# Patient Record
Sex: Female | Born: 1990 | Race: Asian | Hispanic: No | Marital: Married
Health system: Southern US, Community
[De-identification: ages and names within clinical notes are randomized; demographics above are authoritative.]

## PROBLEM LIST (undated history)

## (undated) DIAGNOSIS — O24419 Gestational diabetes mellitus in pregnancy, unspecified control: Secondary | ICD-10-CM

## (undated) DIAGNOSIS — Z789 Other specified health status: Secondary | ICD-10-CM

## (undated) HISTORY — PX: EYE SURGERY: SHX253

## (undated) HISTORY — DX: Gestational diabetes mellitus in pregnancy, unspecified control: O24.419

## (undated) HISTORY — PX: THERAPEUTIC ABORTION: SHX798

## (undated) HISTORY — PX: DILATION AND CURETTAGE OF UTERUS: SHX78

---

## 2017-07-05 DIAGNOSIS — Z3009 Encounter for other general counseling and advice on contraception: Secondary | ICD-10-CM | POA: Diagnosis not present

## 2017-07-05 DIAGNOSIS — Z23 Encounter for immunization: Secondary | ICD-10-CM | POA: Diagnosis not present

## 2017-09-27 DIAGNOSIS — Z3401 Encounter for supervision of normal first pregnancy, first trimester: Secondary | ICD-10-CM | POA: Diagnosis not present

## 2017-11-01 ENCOUNTER — Other Ambulatory Visit (HOSPITAL_COMMUNITY)
Admission: RE | Admit: 2017-11-01 | Discharge: 2017-11-01 | Disposition: A | Discharge status: Home / Self Care | Payer: BLUE CROSS/BLUE SHIELD | Source: Ambulatory Visit | Attending: Obstetrics and Gynecology | Admitting: Obstetrics and Gynecology

## 2017-11-01 ENCOUNTER — Other Ambulatory Visit (HOSPITAL_COMMUNITY): Admit: 2017-11-01 | Payer: Self-pay

## 2017-11-01 ENCOUNTER — Other Ambulatory Visit: Payer: Self-pay | Admitting: Obstetrics and Gynecology

## 2017-11-01 DIAGNOSIS — Z124 Encounter for screening for malignant neoplasm of cervix: Secondary | ICD-10-CM | POA: Insufficient documentation

## 2017-11-01 DIAGNOSIS — Z3401 Encounter for supervision of normal first pregnancy, first trimester: Secondary | ICD-10-CM | POA: Diagnosis not present

## 2017-11-01 DIAGNOSIS — Z3A01 Less than 8 weeks gestation of pregnancy: Secondary | ICD-10-CM | POA: Diagnosis not present

## 2017-11-04 LAB — CYTOLOGY - PAP
CHLAMYDIA, DNA PROBE: NEGATIVE
DIAGNOSIS: NEGATIVE
NEISSERIA GONORRHEA: NEGATIVE

## 2017-11-12 DIAGNOSIS — O09511 Supervision of elderly primigravida, first trimester: Secondary | ICD-10-CM | POA: Diagnosis not present

## 2017-11-21 ENCOUNTER — Other Ambulatory Visit: Payer: Self-pay

## 2017-12-13 DIAGNOSIS — Z36 Encounter for antenatal screening for chromosomal anomalies: Secondary | ICD-10-CM | POA: Diagnosis not present

## 2017-12-13 DIAGNOSIS — Z3402 Encounter for supervision of normal first pregnancy, second trimester: Secondary | ICD-10-CM | POA: Diagnosis not present

## 2017-12-13 DIAGNOSIS — Z3A01 Less than 8 weeks gestation of pregnancy: Secondary | ICD-10-CM | POA: Diagnosis not present

## 2017-12-13 DIAGNOSIS — Z3401 Encounter for supervision of normal first pregnancy, first trimester: Secondary | ICD-10-CM | POA: Diagnosis not present

## 2017-12-14 ENCOUNTER — Other Ambulatory Visit (HOSPITAL_COMMUNITY): Payer: Self-pay | Admitting: Obstetrics and Gynecology

## 2017-12-14 ENCOUNTER — Other Ambulatory Visit (HOSPITAL_COMMUNITY): Payer: Self-pay | Admitting: *Deleted

## 2017-12-14 ENCOUNTER — Encounter (HOSPITAL_COMMUNITY): Payer: Self-pay

## 2017-12-14 ENCOUNTER — Ambulatory Visit (HOSPITAL_COMMUNITY)
Admission: RE | Admit: 2017-12-14 | Discharge: 2017-12-14 | Disposition: A | Discharge status: Home / Self Care | Payer: BLUE CROSS/BLUE SHIELD | Source: Ambulatory Visit | Attending: Obstetrics and Gynecology | Admitting: Obstetrics and Gynecology

## 2017-12-14 DIAGNOSIS — Z368A Encounter for antenatal screening for other genetic defects: Secondary | ICD-10-CM | POA: Insufficient documentation

## 2017-12-14 DIAGNOSIS — O358XX0 Maternal care for other (suspected) fetal abnormality and damage, not applicable or unspecified: Secondary | ICD-10-CM

## 2017-12-14 DIAGNOSIS — Z3A19 19 weeks gestation of pregnancy: Secondary | ICD-10-CM | POA: Diagnosis not present

## 2017-12-14 DIAGNOSIS — O359XX0 Maternal care for (suspected) fetal abnormality and damage, unspecified, not applicable or unspecified: Secondary | ICD-10-CM

## 2017-12-14 DIAGNOSIS — Z363 Encounter for antenatal screening for malformations: Secondary | ICD-10-CM

## 2017-12-14 DIAGNOSIS — IMO0001 Reserved for inherently not codable concepts without codable children: Secondary | ICD-10-CM

## 2017-12-14 HISTORY — DX: Other specified health status: Z78.9

## 2017-12-14 NOTE — Progress Notes (Signed)
Genetic Counseling  High-Risk Gestation Note  Appointment Date:  12/14/2017 Referred By: Christophe Louis, MD Date of Birth:  [birth year] Partner:  Was Dahm   Pregnancy History: G1P0 Estimated Date of Delivery: 05/10/18 Estimated Gestational Age: 54w0dAttending: BAbram Sander MD   I met with Mrs. Dana Reeves for genetic counseling because of abnormal ultrasound findings.  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  Reviewed causes of clubfeet  Multifactorial; genetic (chromosomal or single gene); environmental  Reviewed prior normal NIPS (Panorama)  Reviewed options for additional screening  Genome-wide NIPS (MaterniT Genome)-declined  Serial ultrasound  Consultation with a pediatric orthopedic surgeon-declined  Reviewed options for diagnostic testing, including risks, benefits, limitations and alternatives  Amniocentesis for karyotype and microarray analyses-declined  Patient and partner expressed interest in termination of pregnancy   Discussed at length the usual good prognosis associated with isolated clubfoot  Discussed again the option of a consultation with a pediatric orthopedic surgeon to discuss postnatal management and follow-up prior to TAB; declined  Reviewed family history concerns  noncontributory  Ms. Dana Reeves was sent for ultrasound and consultation today regarding abnormal findings visualized by ultrasound at the referring provider's office on 12/13/17. Ultrasound today confirmed the finding of bilateral clubfeet. There were no other anomalies or markers for aneuploidy visualized by ultrasound. The fetus is female. The report will be documented separately.   We discussed that clubfeet is a term that actually describes three distinct anomalies (talipes equinovarus, talipes calcaneovalgus, and metatarsus varus) and occurs in 1 in 1000 births. The most common type of clubfeet, talipes  equinovarus, is characterized by forefoot adduction with supination, heel varus, and ankle equines, which cannot be brought back to a neutral position. They were counseled that clubfeet can be an isolated difference, occur as a feature of an underlying syndrome, or can result from neurological impairment. We discussed that isolated clubfeet is most often multifactorial in etiology, occurring as the result of a combination of both genetic and environmental factors [infection, drugs, and intrauterine environment (oligohydramnios, fetal positioning)]. They were counseled that isolated, multifactorial clubfeet is more common in males and occurs at a higher frequency among primigravidas. Of note, the fetus is female and this is Ms. Reeves's first pregnancy.   While the majority of cases of clubfeet are isolated, it is a feature in more than 200 known genetic syndromes, including both chromosomal and single gene conditions. We reviewed chromosomes, nondisjunction and the features of Down syndrome, trisomy 170 and 140 We discussed that the risk for other chromosome aberrations is slightly increased (microdeletions, microduplications, insertions, translocations). We reviewed single gene conditions including common inheritance patterns and associated risks for recurrence.   We reviewed the low risk results of Ms. Reeves's Panorama screening and the associated reduction in risks for fetal aneuploidy and 22q11 deletion syndrome. We reviewed that although highly specific and sensitive, this testing is not considered to be diagnostic and does not detect all chromosome aberrations. We reviewed the detection and false positive rates of (Panorama) as well as the option of genome-wide NIPS, which screens for deletions and duplications across the genome (placental) that are at least 7 Mb in size as well as specific microdeletions on a variety of chromosomes. We then discussed the option of amniocentesis, including the limitations,  benefits, and risks. They understand that amniocentesis allows evaluation of the fetal chromosomes, but cannot detect all genetic conditions. Specifically, we discussed that single gene conditions are difficult to diagnose  prenatally unless a specific condition is suspected based on additional ultrasound findings or family history. Additionally, we discussed the availability of microarray analysis, which can be performed pre and postnatally. They were counseled that microarray analysis is a molecular based technique in which a test sample of DNA (fetal) is compared to a reference (normal) genome in order to determine if the test sample has any extra or missing genetic information. Microarray analysis allows for the detection of genetic deletions and duplications that are 484 times smaller than those identified by routine chromosome analysis. We discussed that recent publications show that approximately 6% of patients with an abnormal fetal ultrasound and a normal fetal karyotype had a significant microdeletion/microduplication detected by prenatal microarray analysis. After thoughtful consideration, this couple declined MaterniT Genome (genome-wide NIPS) and amniocentesis.   The patient had MSAFP screening performed through her referring provider's office yesterday. The results were pending at the time of today's ultrasound. There was no evidence of an ONTD by ultrasound today.    We discussed the option of meeting with a pediatric orthopedic specialist to discuss expectant management and treatment of clubfeet. We discussed that in the case of isolated clubfeet, the prognosis is usually very good. This couple declined the option of meeting with a pediatric orthopedic surgeon. They stated that they feel very uncomfortable with the uncertainty of the specific cause (chance that the fetus might have an underlying undetected genetic cause for the clubfeet) as well as the lack of guarantee that their child will  respond well to treatment and have no lasting limitations from the clubfeet. They expressed that they would like to pursue termination of pregnancy. We spent significant time exploring this option and alternatives. They appear to have a good understanding of the ultrasound finding and do not wish to continue the pregnancy. The patient also met with Dr. Abram Sander, MFM, who also discussed this finding and options in detail. The patient was scheduled to have a consultation with Dr. Ebbie Latus, to discuss available TOP options/costs, at 1:00 pm tomorrow (12/15/17) at [facility].  Both family histories were reviewed and found to be noncontributory for birth defects, intellectual disability, and known genetic conditions. Without further information regarding the provided family history, an accurate genetic risk cannot be calculated. Further genetic counseling is warranted if more information is obtained.  Dana Reeves denied exposure to environmental toxins or chemical agents. She denied the use of alcohol, tobacco or street drugs. She denied significant viral illnesses during the course of her pregnancy. Her medical and surgical histories were noncontributory.   I counseled this couple regarding the above risks and available options.  The approximate face-to-face time with the genetic counselor was 43 minutes.  Filbert Schilder, MS  Certified Genetic Counselor

## 2017-12-15 DIAGNOSIS — O359XX0 Maternal care for (suspected) fetal abnormality and damage, unspecified, not applicable or unspecified: Secondary | ICD-10-CM | POA: Diagnosis not present

## 2017-12-15 DIAGNOSIS — Z3A19 19 weeks gestation of pregnancy: Secondary | ICD-10-CM | POA: Diagnosis not present

## 2017-12-16 ENCOUNTER — Ambulatory Visit (HOSPITAL_COMMUNITY): Payer: BLUE CROSS/BLUE SHIELD

## 2017-12-16 ENCOUNTER — Encounter (HOSPITAL_COMMUNITY): Payer: BLUE CROSS/BLUE SHIELD

## 2017-12-20 DIAGNOSIS — O359XX0 Maternal care for (suspected) fetal abnormality and damage, unspecified, not applicable or unspecified: Secondary | ICD-10-CM | POA: Diagnosis not present

## 2017-12-20 DIAGNOSIS — Z3A19 19 weeks gestation of pregnancy: Secondary | ICD-10-CM | POA: Diagnosis not present

## 2017-12-21 DIAGNOSIS — O411221 Chorioamnionitis, second trimester, fetus 1: Secondary | ICD-10-CM | POA: Diagnosis not present

## 2017-12-21 DIAGNOSIS — Z8249 Family history of ischemic heart disease and other diseases of the circulatory system: Secondary | ICD-10-CM | POA: Diagnosis not present

## 2017-12-21 DIAGNOSIS — Z833 Family history of diabetes mellitus: Secondary | ICD-10-CM | POA: Diagnosis not present

## 2017-12-21 DIAGNOSIS — Z3A19 19 weeks gestation of pregnancy: Secondary | ICD-10-CM | POA: Diagnosis not present

## 2017-12-21 DIAGNOSIS — O358XX0 Maternal care for other (suspected) fetal abnormality and damage, not applicable or unspecified: Secondary | ICD-10-CM | POA: Diagnosis not present

## 2017-12-22 DIAGNOSIS — Z833 Family history of diabetes mellitus: Secondary | ICD-10-CM | POA: Diagnosis not present

## 2017-12-22 DIAGNOSIS — Z3A19 19 weeks gestation of pregnancy: Secondary | ICD-10-CM | POA: Diagnosis not present

## 2017-12-22 DIAGNOSIS — O411221 Chorioamnionitis, second trimester, fetus 1: Secondary | ICD-10-CM | POA: Diagnosis not present

## 2017-12-22 DIAGNOSIS — Z8249 Family history of ischemic heart disease and other diseases of the circulatory system: Secondary | ICD-10-CM | POA: Diagnosis not present

## 2018-01-21 DIAGNOSIS — R195 Other fecal abnormalities: Secondary | ICD-10-CM | POA: Diagnosis not present

## 2018-05-26 ENCOUNTER — Ambulatory Visit (INDEPENDENT_AMBULATORY_CARE_PROVIDER_SITE_OTHER): Payer: Self-pay | Admitting: Orthopaedic Surgery

## 2018-05-30 DIAGNOSIS — M25571 Pain in right ankle and joints of right foot: Secondary | ICD-10-CM | POA: Diagnosis not present

## 2018-06-06 DIAGNOSIS — Z3201 Encounter for pregnancy test, result positive: Secondary | ICD-10-CM | POA: Diagnosis not present

## 2018-06-06 DIAGNOSIS — N912 Amenorrhea, unspecified: Secondary | ICD-10-CM | POA: Diagnosis not present

## 2018-06-06 DIAGNOSIS — O3680X0 Pregnancy with inconclusive fetal viability, not applicable or unspecified: Secondary | ICD-10-CM | POA: Diagnosis not present

## 2018-06-06 DIAGNOSIS — Z368A Encounter for antenatal screening for other genetic defects: Secondary | ICD-10-CM | POA: Diagnosis not present

## 2018-06-06 DIAGNOSIS — Z3A01 Less than 8 weeks gestation of pregnancy: Secondary | ICD-10-CM | POA: Diagnosis not present

## 2018-06-06 DIAGNOSIS — Z23 Encounter for immunization: Secondary | ICD-10-CM | POA: Diagnosis not present

## 2018-06-07 DIAGNOSIS — Z368A Encounter for antenatal screening for other genetic defects: Secondary | ICD-10-CM | POA: Diagnosis not present

## 2018-06-30 DIAGNOSIS — Z3A10 10 weeks gestation of pregnancy: Secondary | ICD-10-CM | POA: Diagnosis not present

## 2018-06-30 DIAGNOSIS — Z363 Encounter for antenatal screening for malformations: Secondary | ICD-10-CM | POA: Diagnosis not present

## 2018-06-30 DIAGNOSIS — Z113 Encounter for screening for infections with a predominantly sexual mode of transmission: Secondary | ICD-10-CM | POA: Diagnosis not present

## 2018-06-30 DIAGNOSIS — O26891 Other specified pregnancy related conditions, first trimester: Secondary | ICD-10-CM | POA: Diagnosis not present

## 2018-06-30 DIAGNOSIS — Z3682 Encounter for antenatal screening for nuchal translucency: Secondary | ICD-10-CM | POA: Diagnosis not present

## 2018-06-30 DIAGNOSIS — Z3689 Encounter for other specified antenatal screening: Secondary | ICD-10-CM | POA: Diagnosis not present

## 2018-06-30 LAB — OB RESULTS CONSOLE HIV ANTIBODY (ROUTINE TESTING): HIV: NONREACTIVE

## 2018-06-30 LAB — OB RESULTS CONSOLE ANTIBODY SCREEN: Antibody Screen: NEGATIVE

## 2018-06-30 LAB — OB RESULTS CONSOLE RUBELLA ANTIBODY, IGM: Rubella: IMMUNE

## 2018-06-30 LAB — OB RESULTS CONSOLE RPR: RPR: NONREACTIVE

## 2018-06-30 LAB — OB RESULTS CONSOLE ABO/RH: RH Type: POSITIVE

## 2018-06-30 LAB — OB RESULTS CONSOLE GC/CHLAMYDIA
Chlamydia: NEGATIVE
Gonorrhea: NEGATIVE

## 2018-06-30 LAB — OB RESULTS CONSOLE HEPATITIS B SURFACE ANTIGEN: Hepatitis B Surface Ag: NEGATIVE

## 2018-07-12 DIAGNOSIS — Z3682 Encounter for antenatal screening for nuchal translucency: Secondary | ICD-10-CM | POA: Diagnosis not present

## 2018-07-12 DIAGNOSIS — O09511 Supervision of elderly primigravida, first trimester: Secondary | ICD-10-CM | POA: Diagnosis not present

## 2018-07-12 DIAGNOSIS — O09512 Supervision of elderly primigravida, second trimester: Secondary | ICD-10-CM | POA: Diagnosis not present

## 2018-07-12 DIAGNOSIS — O09521 Supervision of elderly multigravida, first trimester: Secondary | ICD-10-CM | POA: Diagnosis not present

## 2018-07-12 DIAGNOSIS — O09522 Supervision of elderly multigravida, second trimester: Secondary | ICD-10-CM | POA: Diagnosis not present

## 2018-07-12 DIAGNOSIS — Z3A12 12 weeks gestation of pregnancy: Secondary | ICD-10-CM | POA: Diagnosis not present

## 2018-08-02 ENCOUNTER — Encounter (HOSPITAL_COMMUNITY): Payer: Self-pay

## 2018-08-04 ENCOUNTER — Encounter (HOSPITAL_COMMUNITY): Payer: Self-pay

## 2018-08-04 DIAGNOSIS — O3432 Maternal care for cervical incompetence, second trimester: Secondary | ICD-10-CM | POA: Diagnosis not present

## 2018-08-04 DIAGNOSIS — Z3A15 15 weeks gestation of pregnancy: Secondary | ICD-10-CM | POA: Diagnosis not present

## 2018-08-24 NOTE — L&D Delivery Note (Signed)
Delivery Note Pt reached complete dilation and pushed about 40 minutes.  At 6:10 AM a healthy female was delivered via Vaginal, Spontaneous (Presentation: OA  ).  APGAR: 9, 9; weight pending  .   Placenta status: delivered spontaneously, .  Cord:  with the following complications: nuchal x 1--reduced .   Anesthesia:  Epidural Episiotomy: None Lacerations: 2nd degree Suture Repair: 3.0 vicryl rapide Est. Blood Loss (mL):  Mom to postpartum.  Baby to Couplet care / Skin to Skin.  D/w parents circumcision and they decline  Dana Reeves 01/18/2019, 6:45 AM

## 2018-08-30 DIAGNOSIS — Z3A19 19 weeks gestation of pregnancy: Secondary | ICD-10-CM | POA: Diagnosis not present

## 2018-08-30 DIAGNOSIS — Z363 Encounter for antenatal screening for malformations: Secondary | ICD-10-CM | POA: Diagnosis not present

## 2018-08-31 DIAGNOSIS — Z36 Encounter for antenatal screening for chromosomal anomalies: Secondary | ICD-10-CM | POA: Diagnosis not present

## 2018-09-05 ENCOUNTER — Other Ambulatory Visit (HOSPITAL_COMMUNITY): Payer: Self-pay | Admitting: Obstetrics and Gynecology

## 2018-09-05 DIAGNOSIS — Z3689 Encounter for other specified antenatal screening: Secondary | ICD-10-CM

## 2018-09-05 DIAGNOSIS — Z87798 Personal history of other (corrected) congenital malformations: Secondary | ICD-10-CM

## 2018-09-05 DIAGNOSIS — Z3A20 20 weeks gestation of pregnancy: Secondary | ICD-10-CM

## 2018-09-07 ENCOUNTER — Encounter (HOSPITAL_COMMUNITY): Payer: Self-pay | Admitting: *Deleted

## 2018-09-07 ENCOUNTER — Ambulatory Visit (HOSPITAL_COMMUNITY)
Admission: RE | Admit: 2018-09-07 | Discharge: 2018-09-07 | Disposition: A | Discharge status: Home / Self Care | Payer: BLUE CROSS/BLUE SHIELD | Source: Ambulatory Visit | Attending: Obstetrics and Gynecology | Admitting: Obstetrics and Gynecology

## 2018-09-07 DIAGNOSIS — Z363 Encounter for antenatal screening for malformations: Secondary | ICD-10-CM | POA: Diagnosis not present

## 2018-09-07 DIAGNOSIS — O352XX0 Maternal care for (suspected) hereditary disease in fetus, not applicable or unspecified: Secondary | ICD-10-CM | POA: Diagnosis not present

## 2018-09-07 DIAGNOSIS — Z3A20 20 weeks gestation of pregnancy: Secondary | ICD-10-CM

## 2018-09-07 DIAGNOSIS — Z3689 Encounter for other specified antenatal screening: Secondary | ICD-10-CM | POA: Diagnosis not present

## 2018-09-07 DIAGNOSIS — Z87798 Personal history of other (corrected) congenital malformations: Secondary | ICD-10-CM

## 2018-09-12 ENCOUNTER — Encounter (HOSPITAL_COMMUNITY): Payer: Self-pay

## 2018-10-31 DIAGNOSIS — Z3689 Encounter for other specified antenatal screening: Secondary | ICD-10-CM | POA: Diagnosis not present

## 2018-10-31 DIAGNOSIS — Z23 Encounter for immunization: Secondary | ICD-10-CM | POA: Diagnosis not present

## 2018-11-02 DIAGNOSIS — O99810 Abnormal glucose complicating pregnancy: Secondary | ICD-10-CM | POA: Diagnosis not present

## 2018-11-09 ENCOUNTER — Encounter: Payer: Self-pay | Admitting: Skilled Nursing Facility1

## 2018-11-09 ENCOUNTER — Encounter: Payer: BLUE CROSS/BLUE SHIELD | Attending: Obstetrics and Gynecology | Admitting: Skilled Nursing Facility1

## 2018-11-09 ENCOUNTER — Other Ambulatory Visit: Payer: Self-pay

## 2018-11-09 DIAGNOSIS — O24419 Gestational diabetes mellitus in pregnancy, unspecified control: Secondary | ICD-10-CM | POA: Diagnosis not present

## 2018-11-09 NOTE — Progress Notes (Addendum)
Pt states this is her first successful pregnancy and she is about [redacted] weeks along. Pt states she is vegetarian.  Pt states she used to eat ice cream cones but not anymore.  Pt states she only likes apples for fruit Pt was taught how to use glucometer and when/how often to check ehr blood sugars pt is apprehensive of needles and will have her mother or husband prick her finger for her.  Pt was given contour next [id number]    06-24-2019  Goals: Cut your rice back by 1 scoop and switch to brown rice Cut your indian bread by 1 serving  Try soy crumbles  Cook your brown rice in vegetable broth and longer than you would your white rice  If you are going to eat dessert have it as a part of your meal otherwise cut it out until the end of your pregnancy  Diabetes Self-Management Education  Visit Type: First/Initial  11/09/2018  Dana Reeves, identified by name and date of birth, is a 28 y.o. female with a diagnosis of Diabetes: Gestational Diabetes.   ASSESSMENT  Height 4\' 11"  (1.499 m), weight 156 lb 11.2 oz (71.1 kg), last menstrual period 04/15/2018, unknown if currently breastfeeding. Body mass index is 31.65 kg/m.  Diabetes Self-Management Education - 11/09/18 7121      Visit Information   Visit Type  First/Initial      Initial Visit   Diabetes Type  Gestational Diabetes    Are you currently following a meal plan?  No    Are you taking your medications as prescribed?  No      Health Coping   How would you rate your overall health?  Good      Psychosocial Assessment   Patient Belief/Attitude about Diabetes  Motivated to manage diabetes      Pre-Education Assessment   Patient understands the diabetes disease and treatment process.  Needs Instruction    Patient understands incorporating nutritional management into lifestyle.  Needs Instruction    Patient undertands incorporating physical activity into lifestyle.  Needs Instruction    Patient understands using medications  safely.  Needs Instruction    Patient understands monitoring blood glucose, interpreting and using results  Needs Instruction    Patient understands prevention, detection, and treatment of acute complications.  Needs Instruction    Patient understands prevention, detection, and treatment of chronic complications.  Needs Instruction    Patient understands how to develop strategies to address psychosocial issues.  Needs Instruction    Patient understands how to develop strategies to promote health/change behavior.  Needs Instruction      Complications   How often do you check your blood sugar?  0 times/day (not testing)    Number of hypoglycemic episodes per month  0    Number of hyperglycemic episodes per week  0    Have you had a dilated eye exam in the past 12 months?  No    Have you had a dental exam in the past 12 months?  No    Are you checking your feet?  N/A      Dietary Intake   Breakfast  milk and honey in tea    Snack (morning)  2 boiled eggs    Lunch  indian bread and okra or chic pea curry or lentil curry    Snack (afternoon)  chips and salsa     Dinner  indian bread rice and lentils     Beverage(s)  whole milk, tea  with honey and milk, water      Exercise   Exercise Type  ADL's      Patient Education   Previous Diabetes Education  No    Disease state   Factors that contribute to the development of diabetes;Explored patient's options for treatment of their diabetes    Nutrition management   Role of diet in the treatment of diabetes and the relationship between the three main macronutrients and blood glucose level;Carbohydrate counting;Information on hints to eating out and maintain blood glucose control.;Food label reading, portion sizes and measuring food.;Reviewed blood glucose goals for pre and post meals and how to evaluate the patients' food intake on their blood glucose level.    Physical activity and exercise   Role of exercise on diabetes management, blood pressure  control and cardiac health.    Monitoring  Taught/evaluated SMBG meter.;Purpose and frequency of SMBG.;Identified appropriate SMBG and/or A1C goals.;Taught/discussed recording of test results and interpretation of SMBG.    Acute complications  Taught treatment of hypoglycemia - the 15 rule.;Discussed and identified patients' treatment of hyperglycemia.    Chronic complications  Lipid levels, blood glucose control and heart disease;Relationship between chronic complications and blood glucose control    Psychosocial adjustment  Worked with patient to identify barriers to care and solutions;Role of stress on diabetes;Helped patient identify a support system for diabetes management    Preconception care  Reviewed with patient blood glucose goals with pregnancy    Personal strategies to promote health  Helped patient develop diabetes management plan for (enter comment)      Individualized Goals (developed by patient)   Nutrition  Follow meal plan discussed;General guidelines for healthy choices and portions discussed    Physical Activity  Exercise 5-7 days per week;30 minutes per day    Monitoring   test my blood glucose as discussed;test blood glucose pre and post meals as discussed      Post-Education Assessment   Patient understands the diabetes disease and treatment process.  Demonstrates understanding / competency    Patient understands incorporating nutritional management into lifestyle.  Demonstrates understanding / competency    Patient undertands incorporating physical activity into lifestyle.  Demonstrates understanding / competency    Patient understands using medications safely.  Demonstrates understanding / competency    Patient understands monitoring blood glucose, interpreting and using results  Demonstrates understanding / competency    Patient understands prevention, detection, and treatment of acute complications.  Demonstrates understanding / competency    Patient understands  prevention, detection, and treatment of chronic complications.  Demonstrates understanding / competency    Patient understands how to develop strategies to address psychosocial issues.  Demonstrates understanding / competency    Patient understands how to develop strategies to promote health/change behavior.  Demonstrates understanding / competency      Outcomes   Expected Outcomes  Demonstrated interest in learning. Expect positive outcomes    Future DMSE  PRN    Program Status  Completed       Individualized Plan for Diabetes Self-Management Training:   Learning Objective:  Patient will have a greater understanding of diabetes self-management. Patient education plan is to attend individual and/or group sessions per assessed needs and concerns.    Expected Outcomes:  Demonstrated interest in learning. Expect positive outcomes  Education material provided: ADA Diabetes: Your Take Control Guide, My Plate and Snack sheet  If problems or questions, patient to contact team via:  Phone  Future DSME appointment: PRN

## 2018-11-14 DIAGNOSIS — Z23 Encounter for immunization: Secondary | ICD-10-CM | POA: Diagnosis not present

## 2018-12-27 DIAGNOSIS — Z3685 Encounter for antenatal screening for Streptococcus B: Secondary | ICD-10-CM | POA: Diagnosis not present

## 2018-12-27 DIAGNOSIS — Z3A36 36 weeks gestation of pregnancy: Secondary | ICD-10-CM | POA: Diagnosis not present

## 2018-12-27 DIAGNOSIS — O24410 Gestational diabetes mellitus in pregnancy, diet controlled: Secondary | ICD-10-CM | POA: Diagnosis not present

## 2018-12-27 LAB — OB RESULTS CONSOLE GBS: GBS: NEGATIVE

## 2019-01-02 DIAGNOSIS — O24410 Gestational diabetes mellitus in pregnancy, diet controlled: Secondary | ICD-10-CM | POA: Diagnosis not present

## 2019-01-02 DIAGNOSIS — Z3A37 37 weeks gestation of pregnancy: Secondary | ICD-10-CM | POA: Diagnosis not present

## 2019-01-05 DIAGNOSIS — O24410 Gestational diabetes mellitus in pregnancy, diet controlled: Secondary | ICD-10-CM | POA: Diagnosis not present

## 2019-01-05 DIAGNOSIS — Z3A37 37 weeks gestation of pregnancy: Secondary | ICD-10-CM | POA: Diagnosis not present

## 2019-01-09 DIAGNOSIS — Z3A38 38 weeks gestation of pregnancy: Secondary | ICD-10-CM | POA: Diagnosis not present

## 2019-01-09 DIAGNOSIS — O24410 Gestational diabetes mellitus in pregnancy, diet controlled: Secondary | ICD-10-CM | POA: Diagnosis not present

## 2019-01-11 ENCOUNTER — Encounter (HOSPITAL_COMMUNITY): Payer: Self-pay | Admitting: *Deleted

## 2019-01-11 ENCOUNTER — Telehealth (HOSPITAL_COMMUNITY): Payer: Self-pay | Admitting: *Deleted

## 2019-01-11 NOTE — Telephone Encounter (Signed)
Preadmission screen  

## 2019-01-12 ENCOUNTER — Telehealth (HOSPITAL_COMMUNITY): Payer: Self-pay | Admitting: *Deleted

## 2019-01-12 DIAGNOSIS — Z3A38 38 weeks gestation of pregnancy: Secondary | ICD-10-CM | POA: Diagnosis not present

## 2019-01-12 DIAGNOSIS — O24410 Gestational diabetes mellitus in pregnancy, diet controlled: Secondary | ICD-10-CM | POA: Diagnosis not present

## 2019-01-12 NOTE — Telephone Encounter (Signed)
Preadmission screen  

## 2019-01-13 ENCOUNTER — Encounter (HOSPITAL_COMMUNITY): Payer: Self-pay | Admitting: *Deleted

## 2019-01-13 ENCOUNTER — Telehealth (HOSPITAL_COMMUNITY): Payer: Self-pay | Admitting: *Deleted

## 2019-01-13 NOTE — Telephone Encounter (Signed)
Preadmission screen  

## 2019-01-17 DIAGNOSIS — O24410 Gestational diabetes mellitus in pregnancy, diet controlled: Secondary | ICD-10-CM | POA: Diagnosis not present

## 2019-01-17 DIAGNOSIS — Z3A39 39 weeks gestation of pregnancy: Secondary | ICD-10-CM | POA: Diagnosis not present

## 2019-01-18 ENCOUNTER — Inpatient Hospital Stay (HOSPITAL_COMMUNITY): Payer: BLUE CROSS/BLUE SHIELD | Admitting: Anesthesiology

## 2019-01-18 ENCOUNTER — Other Ambulatory Visit: Payer: Self-pay

## 2019-01-18 ENCOUNTER — Other Ambulatory Visit (HOSPITAL_COMMUNITY)
Admission: RE | Admit: 2019-01-18 | Discharge: 2019-01-18 | Disposition: A | Discharge status: Home / Self Care | Payer: BLUE CROSS/BLUE SHIELD | Source: Ambulatory Visit | Attending: Obstetrics and Gynecology | Admitting: Obstetrics and Gynecology

## 2019-01-18 ENCOUNTER — Encounter (HOSPITAL_COMMUNITY): Payer: Self-pay

## 2019-01-18 ENCOUNTER — Inpatient Hospital Stay (HOSPITAL_COMMUNITY)
Admission: AD | Admit: 2019-01-18 | Discharge: 2019-01-20 | DRG: 807 | Disposition: A | Discharge status: Home / Self Care | Payer: BLUE CROSS/BLUE SHIELD | Attending: Obstetrics and Gynecology | Admitting: Obstetrics and Gynecology

## 2019-01-18 DIAGNOSIS — O24420 Gestational diabetes mellitus in childbirth, diet controlled: Secondary | ICD-10-CM | POA: Diagnosis not present

## 2019-01-18 DIAGNOSIS — O26893 Other specified pregnancy related conditions, third trimester: Secondary | ICD-10-CM | POA: Diagnosis not present

## 2019-01-18 DIAGNOSIS — Z3A39 39 weeks gestation of pregnancy: Secondary | ICD-10-CM | POA: Diagnosis not present

## 2019-01-18 DIAGNOSIS — Z1159 Encounter for screening for other viral diseases: Secondary | ICD-10-CM | POA: Diagnosis not present

## 2019-01-18 DIAGNOSIS — O24429 Gestational diabetes mellitus in childbirth, unspecified control: Secondary | ICD-10-CM | POA: Diagnosis not present

## 2019-01-18 LAB — CBC
HCT: 39.6 % (ref 36.0–46.0)
Hemoglobin: 13.2 g/dL (ref 12.0–15.0)
MCH: 27.4 pg (ref 26.0–34.0)
MCHC: 33.3 g/dL (ref 30.0–36.0)
MCV: 82.3 fL (ref 80.0–100.0)
Platelets: 246 10*3/uL (ref 150–400)
RBC: 4.81 MIL/uL (ref 3.87–5.11)
RDW: 14.1 % (ref 11.5–15.5)
WBC: 11.8 10*3/uL — ABNORMAL HIGH (ref 4.0–10.5)
nRBC: 0 % (ref 0.0–0.2)

## 2019-01-18 LAB — TYPE AND SCREEN
ABO/RH(D): A POS
Antibody Screen: NEGATIVE

## 2019-01-18 LAB — SARS CORONAVIRUS 2 BY RT PCR (HOSPITAL ORDER, PERFORMED IN ~~LOC~~ HOSPITAL LAB): SARS Coronavirus 2: NEGATIVE

## 2019-01-18 LAB — RPR: RPR Ser Ql: NONREACTIVE

## 2019-01-18 MED ORDER — LACTATED RINGERS IV SOLN
INTRAVENOUS | Status: DC
  Administered 2019-01-18 (×2): via INTRAVENOUS

## 2019-01-18 MED ORDER — ACETAMINOPHEN 325 MG PO TABS
650.0000 mg | ORAL_TABLET | ORAL | Status: DC | PRN
  Administered 2019-01-18: 650 mg via ORAL
  Filled 2019-01-18: qty 2

## 2019-01-18 MED ORDER — EPHEDRINE 5 MG/ML INJ: 10.0000 mg | INTRAVENOUS | Status: DC | PRN

## 2019-01-18 MED ORDER — LIDOCAINE HCL (PF) 1 % IJ SOLN
INTRAMUSCULAR | Status: DC | PRN
  Administered 2019-01-18 (×2): 4 mL via EPIDURAL

## 2019-01-18 MED ORDER — SODIUM CHLORIDE (PF) 0.9 % IJ SOLN
INTRAMUSCULAR | Status: DC | PRN
  Administered 2019-01-18: 10 mL/h via EPIDURAL

## 2019-01-18 MED ORDER — ONDANSETRON HCL 4 MG/2ML IJ SOLN: 4.0000 mg | INTRAMUSCULAR | Status: DC | PRN

## 2019-01-18 MED ORDER — DIPHENHYDRAMINE HCL 50 MG/ML IJ SOLN: 12.5000 mg | INTRAMUSCULAR | Status: DC | PRN

## 2019-01-18 MED ORDER — IBUPROFEN 600 MG PO TABS
600.0000 mg | ORAL_TABLET | Freq: Four times a day (QID) | ORAL | Status: DC
  Administered 2019-01-18 – 2019-01-20 (×8): 600 mg via ORAL
  Filled 2019-01-18 (×8): qty 1

## 2019-01-18 MED ORDER — PHENYLEPHRINE 40 MCG/ML (10ML) SYRINGE FOR IV PUSH (FOR BLOOD PRESSURE SUPPORT): 80.0000 ug | PREFILLED_SYRINGE | INTRAVENOUS | Status: DC | PRN

## 2019-01-18 MED ORDER — LACTATED RINGERS IV SOLN: 500.0000 mL | Freq: Once | INTRAVENOUS | Status: DC

## 2019-01-18 MED ORDER — ONDANSETRON HCL 4 MG PO TABS: 4.0000 mg | ORAL_TABLET | ORAL | Status: DC | PRN

## 2019-01-18 MED ORDER — BENZOCAINE-MENTHOL 20-0.5 % EX AERO
1.0000 "application " | INHALATION_SPRAY | CUTANEOUS | Status: DC | PRN
  Filled 2019-01-18: qty 56

## 2019-01-18 MED ORDER — ONDANSETRON HCL 4 MG/2ML IJ SOLN: 4.0000 mg | Freq: Four times a day (QID) | INTRAMUSCULAR | Status: DC | PRN

## 2019-01-18 MED ORDER — ACETAMINOPHEN 325 MG PO TABS: 650.0000 mg | ORAL_TABLET | ORAL | Status: DC | PRN

## 2019-01-18 MED ORDER — FENTANYL-BUPIVACAINE-NACL 0.5-0.125-0.9 MG/250ML-% EP SOLN: 12.0000 mL/h | EPIDURAL | Status: DC | PRN

## 2019-01-18 MED ORDER — WITCH HAZEL-GLYCERIN EX PADS: 1.0000 "application " | MEDICATED_PAD | CUTANEOUS | Status: DC | PRN

## 2019-01-18 MED ORDER — OXYCODONE-ACETAMINOPHEN 5-325 MG PO TABS: 1.0000 | ORAL_TABLET | ORAL | Status: DC | PRN

## 2019-01-18 MED ORDER — LIDOCAINE HCL (PF) 1 % IJ SOLN: 30.0000 mL | INTRAMUSCULAR | Status: DC | PRN

## 2019-01-18 MED ORDER — OXYCODONE-ACETAMINOPHEN 5-325 MG PO TABS: 2.0000 | ORAL_TABLET | ORAL | Status: DC | PRN

## 2019-01-18 MED ORDER — FLEET ENEMA 7-19 GM/118ML RE ENEM: 1.0000 | ENEMA | RECTAL | Status: DC | PRN

## 2019-01-18 MED ORDER — SOD CITRATE-CITRIC ACID 500-334 MG/5ML PO SOLN: 30.0000 mL | ORAL | Status: DC | PRN

## 2019-01-18 MED ORDER — SIMETHICONE 80 MG PO CHEW: 80.0000 mg | CHEWABLE_TABLET | ORAL | Status: DC | PRN

## 2019-01-18 MED ORDER — ZOLPIDEM TARTRATE 5 MG PO TABS: 5.0000 mg | ORAL_TABLET | Freq: Every evening | ORAL | Status: DC | PRN

## 2019-01-18 MED ORDER — FENTANYL-BUPIVACAINE-NACL 0.5-0.125-0.9 MG/250ML-% EP SOLN
EPIDURAL | Status: AC
  Filled 2019-01-18: qty 250

## 2019-01-18 MED ORDER — OXYTOCIN BOLUS FROM INFUSION
500.0000 mL | Freq: Once | INTRAVENOUS | Status: AC
  Administered 2019-01-18: 500 mL via INTRAVENOUS

## 2019-01-18 MED ORDER — SENNOSIDES-DOCUSATE SODIUM 8.6-50 MG PO TABS
2.0000 | ORAL_TABLET | ORAL | Status: DC
  Administered 2019-01-18 – 2019-01-19 (×2): 2 via ORAL
  Filled 2019-01-18 (×2): qty 2

## 2019-01-18 MED ORDER — LACTATED RINGERS IV SOLN: 500.0000 mL | INTRAVENOUS | Status: DC | PRN

## 2019-01-18 MED ORDER — DIBUCAINE (PERIANAL) 1 % EX OINT
1.0000 "application " | TOPICAL_OINTMENT | CUTANEOUS | Status: DC | PRN
  Administered 2019-01-18: 1 via RECTAL
  Filled 2019-01-18: qty 28

## 2019-01-18 MED ORDER — OXYTOCIN 40 UNITS IN NORMAL SALINE INFUSION - SIMPLE MED
2.5000 [IU]/h | INTRAVENOUS | Status: DC
  Filled 2019-01-18: qty 1000

## 2019-01-18 MED ORDER — TETANUS-DIPHTH-ACELL PERTUSSIS 5-2.5-18.5 LF-MCG/0.5 IM SUSP: 0.5000 mL | Freq: Once | INTRAMUSCULAR | Status: DC

## 2019-01-18 MED ORDER — PRENATAL MULTIVITAMIN CH
1.0000 | ORAL_TABLET | Freq: Every day | ORAL | Status: DC
  Administered 2019-01-18 – 2019-01-19 (×2): 1 via ORAL
  Filled 2019-01-18 (×2): qty 1

## 2019-01-18 MED ORDER — DIPHENHYDRAMINE HCL 25 MG PO CAPS: 25.0000 mg | ORAL_CAPSULE | Freq: Four times a day (QID) | ORAL | Status: DC | PRN

## 2019-01-18 MED ORDER — COCONUT OIL OIL
1.0000 "application " | TOPICAL_OIL | Status: DC | PRN
  Administered 2019-01-19: 1 via TOPICAL

## 2019-01-18 NOTE — Anesthesia Procedure Notes (Signed)
Epidural Patient location during procedure: OB Start time: 01/18/2019 3:04 AM End time: 01/18/2019 3:07 AM  Staffing Anesthesiologist: Kaylyn Layer, MD Performed: anesthesiologist   Preanesthetic Checklist Completed: patient identified, pre-op evaluation, timeout performed, IV checked, risks and benefits discussed and monitors and equipment checked  Epidural Patient position: sitting Prep: site prepped and draped and DuraPrep Patient monitoring: continuous pulse ox, blood pressure, heart rate and cardiac monitor Approach: midline Location: L3-L4 Injection technique: LOR air  Needle:  Needle type: Tuohy  Needle gauge: 17 G Needle length: 9 cm Needle insertion depth: 6 cm Catheter type: closed end flexible Catheter size: 19 Gauge Catheter at skin depth: 11 cm Test dose: negative and Other (1% lidocaine)  Assessment Events: blood not aspirated, injection not painful, no injection resistance, negative IV test and no paresthesia  Additional Notes Patient identified. Risks, benefits, and alternatives discussed with patient including but not limited to bleeding, infection, nerve damage, paralysis, failed block, incomplete pain control, headache, blood pressure changes, nausea, vomiting, reactions to medication, itching, and postpartum back pain. Confirmed with bedside nurse the patient's most recent platelet count. Confirmed with patient that they are not currently taking any anticoagulation, have any bleeding history, or any family history of bleeding disorders. Patient expressed understanding and wished to proceed. All questions were answered. Sterile technique was used throughout the entire procedure. Please see nursing notes for vital signs. Crisp LOR on first pass. Test dose was given through epidural catheter and negative prior to continuing to dose epidural or start infusion. Warning signs of high block given to the patient including shortness of breath, tingling/numbness in  hands, complete motor block, or any concerning symptoms with instructions to call for help. Patient was given instructions on fall risk and not to get out of bed. All questions and concerns addressed with instructions to call with any issues or inadequate analgesia.  Reason for block:procedure for pain

## 2019-01-18 NOTE — Lactation Note (Signed)
This note was copied from a baby's chart. Lactation Consultation Note  Patient Name: Dana Reeves FEOFH'Q Date: 01/18/2019 Reason for consult: Initial assessment;Primapara;1st time breastfeeding;Difficult latch;Term;Other (Comment)(MBURN started a NS #16 - #20 NS this am )  Baby is 54 hours old ,  Per mom the baby last fed at 28 for 20 mins. Mom mentioned the nurse started the NS this am and since has latched without it.  Since the it had been since 10 20 when the baby fed, LC offered to check the diaper and  Change if needed and place the baby STS. Baby had a small wet and he woke up.  LC reviewed hand expressing, no results from the right , and the left small drops, ' Areolas semi compressible with erect short shaft nipples.  Attempted to latch without the NS and baby was on off, 1st tried the #20 NS and per mom was pinching, 'released  The baby and switch to a # 16 , borderline snug, per mom more comfortable and colostrum in the NS after the baby fed,  And several swallows and mom more comfortable.  LC mentioned to mom with pre - pumping , shells, and post pumping she may be up to a #20 NS by tonight.  Baby was able to open wide enough to latch with depth on the #20 NS,mom was just uncomfortable.  [place] set up the DEBP and instructed mom on the initiation phase / also showed mom how to use the hand pump from the  Kit for pre - pumping to pull the nipple outward and primp the milk ducts and prevent soreness.  ( mom aware why the LC is recommending it )  Moms lunch came at the same time so LC recommended starting the pre-pumping and post pumping at the next feeding.  Reviewed cleaning and storage of breast milk. Instructed mom on the use shells between feedings except when sleeping.   Per mom will have a DEBP at home.  [place] resources for after D/C reviewed and pamphlet provided.  LC also mentioned to mom if she is still having to use the NS for latching at D/C and LC O/P appt would  Be  recommended in 5 -7 days for reassessment.   Mom receptive to breast feeding teaching and expressed appreciation for the assistance to get her baby to feed.     Maternal Data Has patient been taught Hand Expression?: Yes(none from the right , small drops from the left ) Does the patient have breastfeeding experience prior to this delivery?: No  Feeding Feeding Type: Breast Fed  LATCH Score Latch: Grasps breast easily, tongue down, lips flanged, rhythmical sucking.  Audible Swallowing: Spontaneous and intermittent  Type of Nipple: Everted at rest and after stimulation(semir compressible short shaft nipple )  Comfort (Breast/Nipple): Soft / non-tender  Hold (Positioning): Assistance needed to correctly position infant at breast and maintain latch.  LATCH Score: 9  Interventions Interventions: Breast feeding basics reviewed;Assisted with latch;Skin to skin;Breast massage;Hand express;Breast compression;Adjust position;Support pillows;Position options;Expressed milk;Shells;DEBP  Lactation Tools Discussed/Used Tools: Shells;Pump;Nipple Shields Nipple shield size: 16;20;Other (comment)(1st tried the #20 NS / slightly to large - #16 NS used for latch ) Shell Type: Inverted Breast pump type: Double-Electric Breast Pump Pump Review: Setup, frequency, and cleaning;Milk Storage Initiated by:: [name]  Date initiated:: 01/18/19   Consult Status Consult Status: Follow-up Date: 01/19/19 Follow-up type: In-patient    [place] 01/18/2019, 2:29 PM

## 2019-01-18 NOTE — Progress Notes (Signed)
Patient ID: Dana Reeves, female   DOB: 11/03/1990, 28 y.o.   MRN: [id number]  PPD#0 Pt doing well with no complaints. Bonding well with baby. Denies any pain, HA or SOB. Lochia mild. Has no complaints VSS GEN - NAD FF per nursing  Plan: Routine pp care

## 2019-01-18 NOTE — MAU Note (Signed)
Pt states she was checked in the office yesterday and was 3.5-4 cm dilated.   Pt states she started having regular ctx's at midnight tonight.     Denies vaginal bleeding or LOF.   Reports +FM

## 2019-01-18 NOTE — H&P (Signed)
Dana DoomSunita Reeves is a [age] y.o. female G2P0010 at 6839 5/7 weeks (EDD 01/20/19 by LMP c/w 10 week US)  presenting for painful contractions and cervical change in MAU from 4cm to 6cm.   Prenatal care significant for GDM and has been pretty well controlled on diet, declining metformin for FBS but able to bring them under control with diet changes.  She had a prior pregnancy with baby that had bilateral club feet, normal NIPS and elected termination at 19 weeks with laminara and D&C.  Her cervical lengths were followed this pregnancy in the second trimester and were normal.   OB History    Gravida  2   Para  0   Term  0   Preterm  0   AB  1   Living        SAB  0   TAB  1   Ectopic  0   Multiple      Live Births            2019 16 week D&E  Past Medical History:  Diagnosis Date  . Gestational diabetes    metformin  . Medical history non-contributory    Past Surgical History:  Procedure Laterality Date  . DILATION AND CURETTAGE OF UTERUS    . THERAPEUTIC ABORTION     Family History: family history includes Diabetes in her mother; Hypertension in her father. Social History:  reports that she has never smoked. She has never used smokeless tobacco. She reports previous alcohol use. She reports that she does not use drugs.     Maternal Diabetes: Yes:  Diabetes Type:  Diet controlled Genetic Screening: Normal Maternal Ultrasounds/Referrals: Normal Fetal Ultrasounds or other Referrals:  Referred to Materal Fetal Medicine  for US per patient request and all WNL Maternal Substance Abuse:  No Significant Maternal Medications:  None Significant Maternal Lab Results:  None Other Comments:  None  Review of Systems  Constitutional: Negative for fever.  Cardiovascular: Negative.   Gastrointestinal: Positive for abdominal pain.   Maternal Medical History:  Reason for admission: Contractions.   Contractions: Onset was 6-12 hours ago.   Frequency: regular.   Perceived severity is  strong.    Fetal activity: Perceived fetal activity is normal.    Prenatal Complications - Diabetes: gestational. Diabetes is managed by diet.      Dilation: 9 Effacement (%): 90 Station: -1 Exam by:: K.Cowher RN/ S.Brown RN Blood pressure (!) 105/50, pulse (!) 104, temperature 98.7 F (37.1 C), temperature source Oral, resp. rate 16, last menstrual period 04/15/2018, SpO2 100 %, unknown if currently breastfeeding. Maternal Exam:  Uterine Assessment: Contraction strength is moderate.  Contraction frequency is regular.   Abdomen: Patient reports no abdominal tenderness. Fetal presentation: vertex  Introitus: Normal vulva. Normal vagina.    Physical Exam  Constitutional: She appears well-developed.  Cardiovascular: Normal rate and regular rhythm.  Respiratory: Effort normal.  GI: Soft.  Genitourinary:    Vulva normal.   Neurological: She is alert.  Psychiatric: She has a normal mood and affect.    Prenatal labs: ABO, Rh: --/--/A POS, A POS Performed at [facility], [address] [place], [place] [address]  [phone](05/27 [id number]) Antibody: NEG (05/27 0224) Rubella: Immune (11/07 0000) RPR: Nonreactive (11/07 0000)  HBsAg: Negative (11/07 0000)  HIV: Non-reactive (11/07 0000)  GBS: Negative (05/05 0000)  NIPS low risk, female Essential panel negative Hgb AA AFP negative  Assessment/Plan: Pt admitted in active labor and now with cervical  change to 9 cm. Just received epidural and when comfortable will AROM.    Oliver Pila 01/18/2019, 4:16 AM

## 2019-01-18 NOTE — Anesthesia Postprocedure Evaluation (Signed)
Anesthesia Post Note  Patient: Automotive engineer  Procedure(s) Performed: AN AD HOC LABOR EPIDURAL     Patient location during evaluation: Mother Baby Anesthesia Type: Epidural Level of consciousness: awake Pain management: satisfactory to patient Vital Signs Assessment: post-procedure vital signs reviewed and stable Respiratory status: spontaneous breathing Cardiovascular status: stable Anesthetic complications: no    Last Vitals:  Vitals:   01/18/19 0910 01/18/19 1514  BP: (!) 91/56 (!) 95/47  Pulse: 79 81  Resp: 19 19  Temp: 37.2 C 37 C  SpO2: 100%     Last Pain:  Vitals:   01/18/19 0915  TempSrc:   PainSc: 0-No pain   Pain Goal:                   [employer]

## 2019-01-18 NOTE — Progress Notes (Signed)
Patient ID: Dana Reeves, female   DOB: [birth year], 28 y.o.   MRN: [id number] Pt comfortable with epidural, feeling some pressure  afeb VSS   Cervix c/c/+1 AROM clear  Will begin pushing soon

## 2019-01-18 NOTE — Anesthesia Preprocedure Evaluation (Addendum)
Anesthesia Evaluation  Patient identified by MRN, date of birth, ID band Patient awake    Reviewed: Allergy & Precautions, Patient's Chart, lab work & pertinent test results  History of Anesthesia Complications Negative for: history of anesthetic complications  Airway Mallampati: II  TM Distance: >3 FB Neck ROM: Full    Dental no notable dental hx.    Pulmonary neg pulmonary ROS,    Pulmonary exam normal        Cardiovascular negative cardio ROS Normal cardiovascular exam     Neuro/Psych negative neurological ROS     GI/Hepatic negative GI ROS, Neg liver ROS,   Endo/Other  diabetes, Gestational  Renal/GU negative Renal ROS     Musculoskeletal negative musculoskeletal ROS (+)   Abdominal   Peds  Hematology negative hematology ROS (+)   Anesthesia Other Findings Day of surgery medications reviewed with the patient.  Reproductive/Obstetrics (+) Pregnancy                            Anesthesia Physical Anesthesia Plan  ASA: II  Anesthesia Plan: Epidural   Post-op Pain Management:    Induction:   PONV Risk Score and Plan: Treatment may vary due to age or medical condition  Airway Management Planned: Natural Airway  Additional Equipment:   Intra-op Plan:   Post-operative Plan:   Informed Consent: I have reviewed the patients History and Physical, chart, labs and discussed the procedure including the risks, benefits and alternatives for the proposed anesthesia with the patient or authorized representative who has indicated his/her understanding and acceptance.       Plan Discussed with:   Anesthesia Plan Comments:         Anesthesia Quick Evaluation

## 2019-01-19 LAB — ABO/RH: ABO/RH(D): A POS

## 2019-01-19 LAB — CBC
HCT: 36.2 % (ref 36.0–46.0)
Hemoglobin: 12.2 g/dL (ref 12.0–15.0)
MCH: 27.5 pg (ref 26.0–34.0)
MCHC: 33.7 g/dL (ref 30.0–36.0)
MCV: 81.5 fL (ref 80.0–100.0)
Platelets: 226 10*3/uL (ref 150–400)
RBC: 4.44 MIL/uL (ref 3.87–5.11)
RDW: 14.5 % (ref 11.5–15.5)
WBC: 13.5 10*3/uL — ABNORMAL HIGH (ref 4.0–10.5)
nRBC: 0 % (ref 0.0–0.2)

## 2019-01-19 NOTE — Progress Notes (Signed)
Post Partum Day 1 Subjective: no complaints, up ad lib, voiding, tolerating PO and nl lochia, pain controlled  Objective: Blood pressure 96/63, pulse 79, temperature 98.2 F (36.8 C), temperature source Oral, resp. rate 16, last menstrual period 04/15/2018, SpO2 100 %, unknown if currently breastfeeding.  Physical Exam:  General: alert and no distress Lochia: appropriate Uterine Fundus: firm   Recent Labs    01/18/19 0224 01/19/19 0547  HGB 13.2 12.2  HCT 39.6 36.2    Assessment/Plan: Plan for discharge tomorrow.  Routine PP care.     LOS: 1 day   Dana Reeves 01/19/2019, 7:25 AM

## 2019-01-19 NOTE — Lactation Note (Signed)
This note was copied from a baby's chart. Lactation Consultation Note Baby 21 hrs old at time of consult.  Parents having difficulty latching. Baby in cradle position BF w/#16 NS w/shallow latch. Lips not touching breast at times. NS not turned so nose touches skin. Mom unlatched baby. LC repositioned baby in football hold.  Noted NS full of colostrum. Mom and FOB excited.  Encouraged assessing breast for transfer. Noted significant softening of breast after feeding. Taught mom "C" hold when latching. Mom placed NS on nipple incorrectly. Demonstrated application. Mom reapplied correctly. Encouraged occassional breast massage while feeding.  Mom holding baby STS during feedings. Baby having constant swallows while feeding. Baby satisfied after feeding.  Encouraged mom to rest while baby is resting. Mom very sleepy from BF. Encouraged to call for assistance or concerns.  Patient Name: Dana Reeves [id number]'U Date: 01/19/2019 Reason for consult: Mother's request;Difficult latch;1st time breastfeeding   Maternal Data    Feeding Feeding Type: Breast Fed  LATCH Score Latch: Grasps breast easily, tongue down, lips flanged, rhythmical sucking.  Audible Swallowing: Spontaneous and intermittent  Type of Nipple: Everted at rest and after stimulation(semi flat/very short shaft)  Comfort (Breast/Nipple): Filling, red/small blisters or bruises, mild/mod discomfort  Hold (Positioning): Assistance needed to correctly position infant at breast and maintain latch.  LATCH Score: 8  Interventions Interventions: Breast feeding basics reviewed;Adjust position;Assisted with latch;Support pillows;Skin to skin;Position options;Breast massage;Hand express;Pre-pump if needed;Shells;Reverse pressure;Breast compression;Hand pump  Lactation Tools Discussed/Used Tools: Pump;Nipple Shields Nipple shield size: 16 Shell Type: Inverted   Consult Status Consult Status: Follow-up Date:  01/20/19 Follow-up type: In-patient    Charyl Dancer 01/19/2019, 6:34 AM

## 2019-01-20 ENCOUNTER — Ambulatory Visit: Payer: Self-pay

## 2019-01-20 ENCOUNTER — Inpatient Hospital Stay (HOSPITAL_COMMUNITY): Payer: BLUE CROSS/BLUE SHIELD

## 2019-01-20 MED ORDER — IBUPROFEN 600 MG PO TABS: 600.0000 mg | ORAL_TABLET | Freq: Four times a day (QID) | ORAL | 0 refills | Status: AC

## 2019-01-20 MED ORDER — ACETAMINOPHEN 325 MG PO TABS: 650.0000 mg | ORAL_TABLET | ORAL | 0 refills | Status: AC | PRN

## 2019-01-20 NOTE — Progress Notes (Signed)
Post Partum Day 2 Subjective: no complaints and tolerating PO  Objective: Blood pressure 113/72, pulse 85, temperature 98.1 F (36.7 C), temperature source Oral, resp. rate 16, last menstrual period 04/15/2018, SpO2 100 %, unknown if currently breastfeeding.  Physical Exam:  General: alert and cooperative Lochia: appropriate Uterine Fundus: firm   Recent Labs    01/18/19 0224 01/19/19 0547  HGB 13.2 12.2  HCT 39.6 36.2    Assessment/Plan: Discharge home  Declines circumcision   LOS: 2 days   Dana Reeves 01/20/2019, 10:52 AM

## 2019-01-20 NOTE — Discharge Summary (Signed)
OB Discharge Summary     Patient Name: Dana KelpSunita Brame DOB: 04/22/1991 MRN: [id number]  Date of admission: 01/18/2019 Delivering MD: Huel CoteICHARDSON, Ogechi Kuehnel   Date of discharge: 01/20/2019  Admitting diagnosis: 28 WKS, CTX Intrauterine pregnancy: 9872w5d     Secondary diagnosis:  Active Problems:   Indication for care in labor or delivery   NSVD (normal spontaneous vaginal delivery)  Additional problems: none     Discharge diagnosis: Term Pregnancy Delivered                                                                                                Post partum procedures:none  Augmentation: AROM  Complications: None  Hospital course:  Onset of Labor With Vaginal Delivery     28 y.o. yo G2P1011 at 1772w5d was admitted in Active Labor on 01/18/2019. Patient had an uncomplicated labor course as follows:  Membrane Rupture Time/Date: 5:01 AM ,01/18/2019   Intrapartum Procedures: Episiotomy: None [1]                                         Lacerations:  2nd degree [3]  Patient had a delivery of a Viable infant. 01/18/2019  Information for the patient's newborn:  Vinie SillMengar, Boy Jenisha [[id number]][[id number]]  Delivery Method: Vaginal, Spontaneous(Filed from Delivery Summary)    Pateint had an uncomplicated postpartum course.  She is ambulating, tolerating a regular diet, passing flatus, and urinating well. Patient is discharged home in stable condition on 01/20/19. Declines circumcision.  Physical exam  Vitals:   01/19/19 0550 01/19/19 1435 01/19/19 2139 01/20/19 0534  BP: 96/63 (!) 102/50 114/64 113/72  Pulse: 79 85 87 85  Resp: 16 16 16 16   Temp: 98.2 F (36.8 C) 98.2 F (36.8 C) 98 F (36.7 C) 98.1 F (36.7 C)  TempSrc: Oral Oral Axillary Oral  SpO2: 100%      General: alert and cooperative Lochia: appropriate Uterine Fundus: firm  Labs: Lab Results  Component Value Date   WBC 13.5 (H) 01/19/2019   HGB 12.2 01/19/2019   HCT 36.2 01/19/2019   MCV 81.5 01/19/2019   PLT 226 01/19/2019    No flowsheet data found.  Discharge instruction: per After Visit Summary and "Baby and Me Booklet".  After visit meds:  Allergies as of 01/20/2019   No Known Allergies     Medication List    TAKE these medications   acetaminophen 325 MG tablet Commonly known as:  Tylenol Take 2 tablets (650 mg total) by mouth every 4 (four) hours as needed (for pain scale < 4).   ibuprofen 600 MG tablet Commonly known as:  ADVIL Take 1 tablet (600 mg total) by mouth every 6 (six) hours.   PRENATAL VITAMIN PO Take 1 tablet by mouth daily.       Diet: routine diet  Activity: Advance as tolerated. Pelvic rest for 6 weeks.   Outpatient follow up:6 weeks Follow up Appt:No future appointments. Follow up Visit:No follow-ups on file.  Postpartum contraception: Condoms  Newborn Data:  Live born female  Birth Weight: 6 lb 4.9 oz (2860 g) APGAR: 9, 9  Newborn Delivery   Birth date/time:  01/18/2019 06:10:00 Delivery type:  Vaginal, Spontaneous     Baby Feeding: Breast Disposition:home with mother   01/20/2019 Oliver Pila, MD

## 2019-01-20 NOTE — Lactation Note (Signed)
This note was copied from a baby's chart. Lactation Consultation Note  Patient Name: Dana Reeves HWKGS'U Date: 01/20/2019   Mom-baby dyad were discharged before being seen by lactation on 5/29.    Lurline Hare [facility] 01/20/2019, 12:44 PM

## 2019-03-03 DIAGNOSIS — Z1389 Encounter for screening for other disorder: Secondary | ICD-10-CM | POA: Diagnosis not present

## 2019-03-03 DIAGNOSIS — Z3009 Encounter for other general counseling and advice on contraception: Secondary | ICD-10-CM | POA: Diagnosis not present

## 2019-08-25 IMAGING — US US MFM OB DETAIL +14 WK
1 series · 13 of 28 positions shown · non-contrast
Comparison: none

[Series 1: us mfm ob detail +14 wk · 13 of 100 slices shown]
[im 4/100]
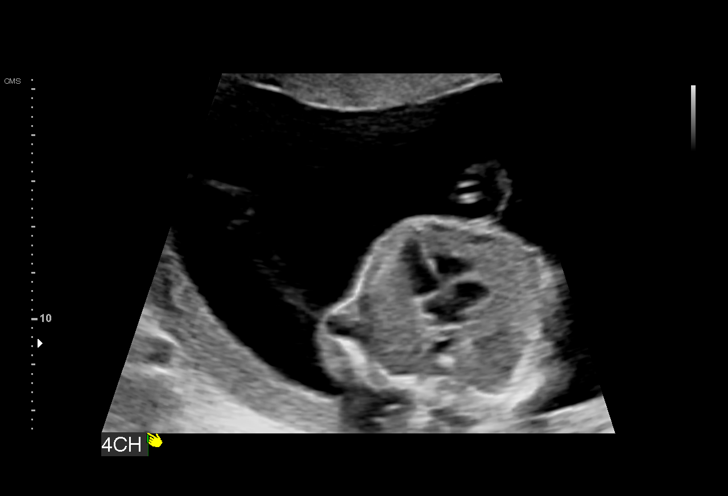
[im 12/100]
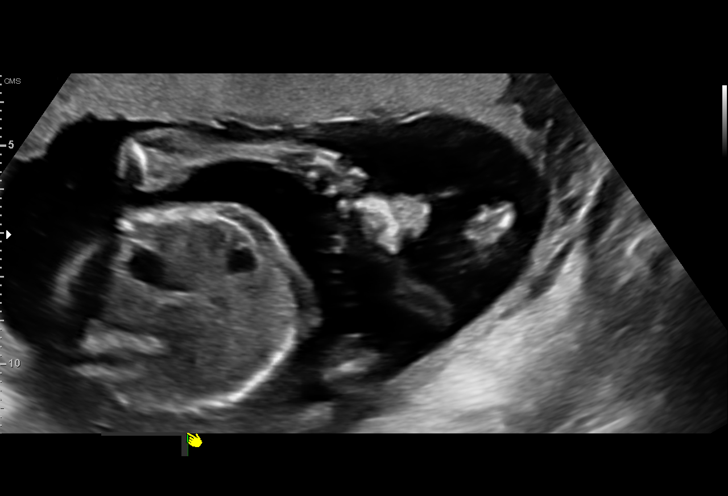
[im 19/100]
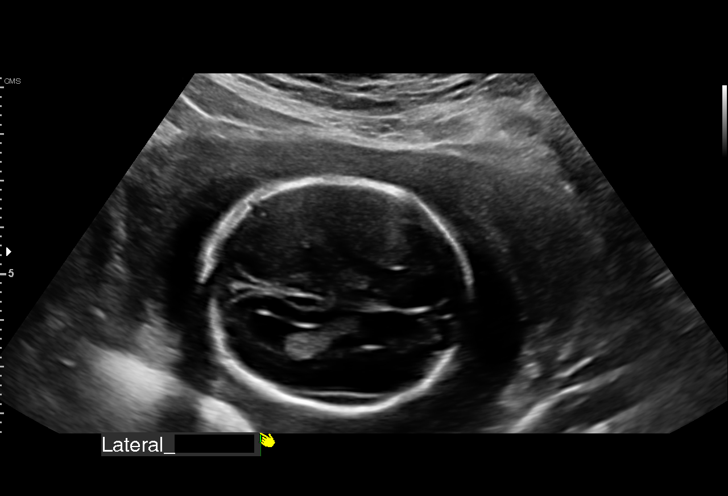
[im 26/100]
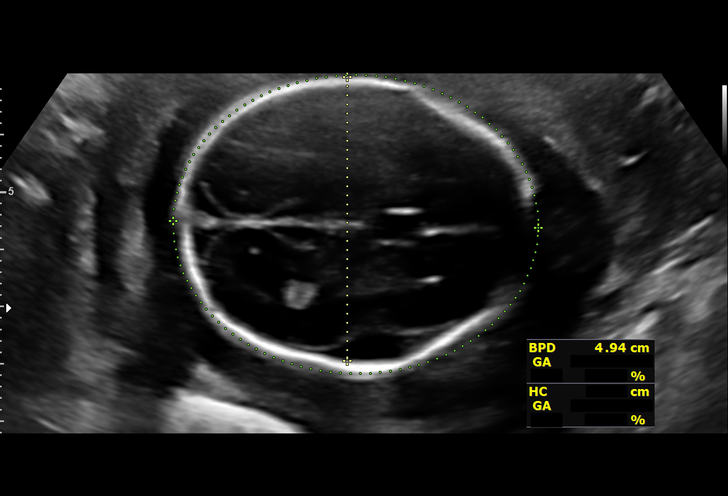
[im 34/100]
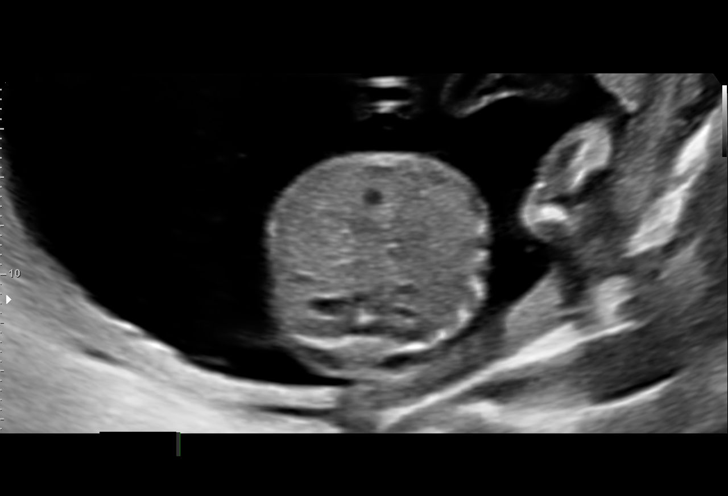
[im 41/100]
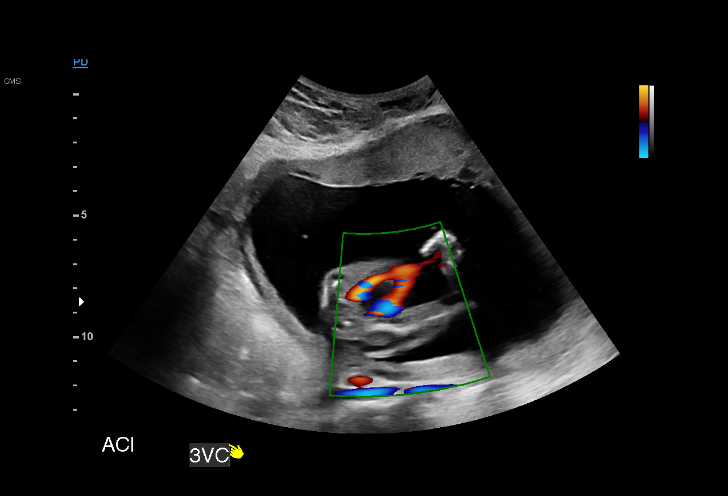
[im 52/100]
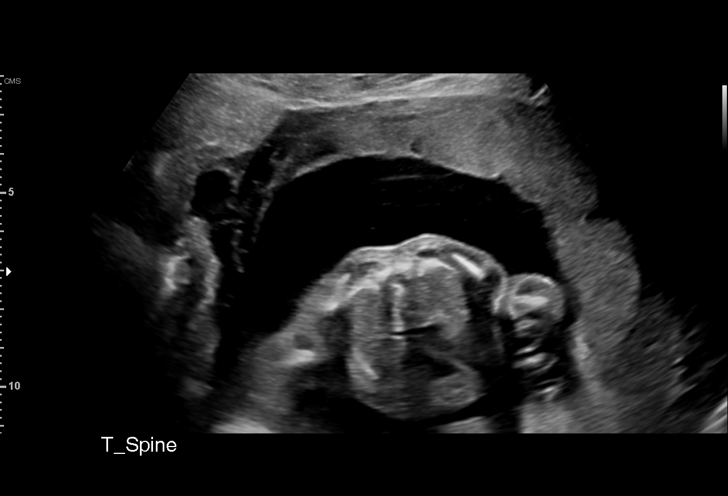
[im 59/100]
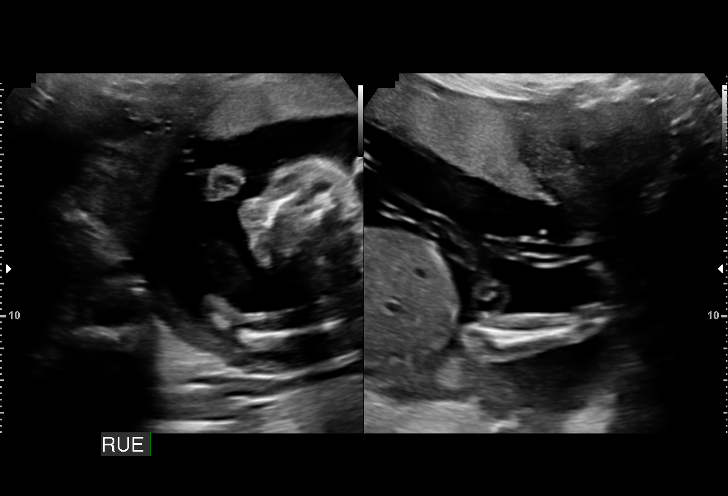
[im 67/100]
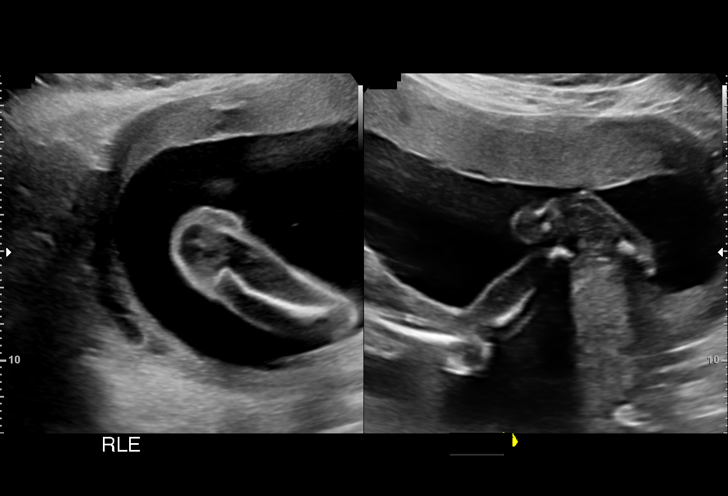
[im 74/100]
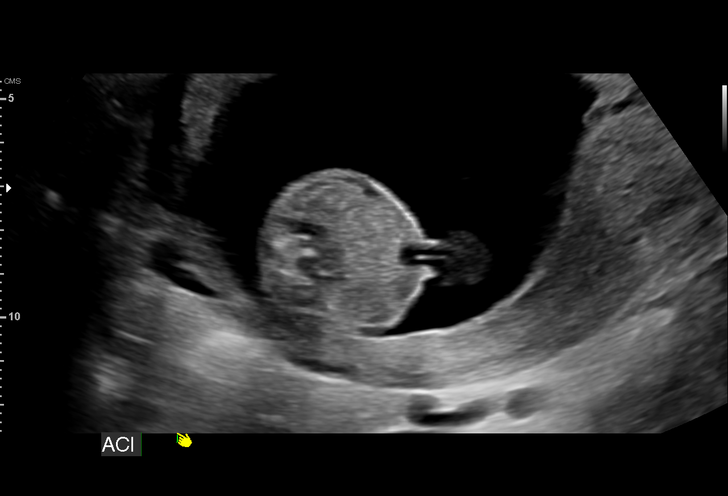
[im 81/100]
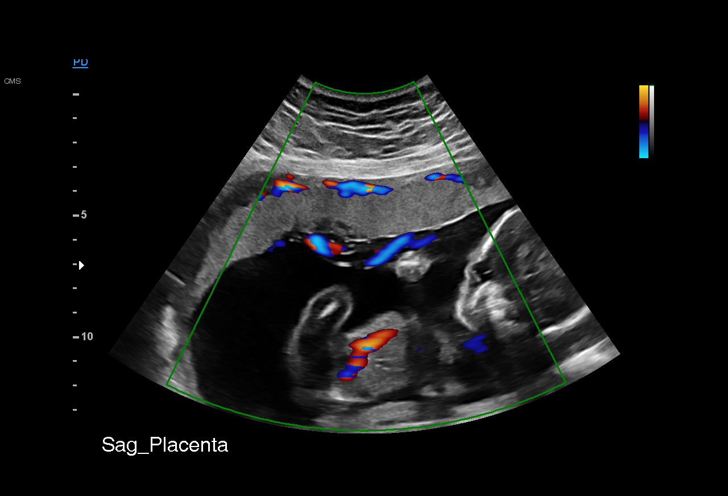
[im 89/100]
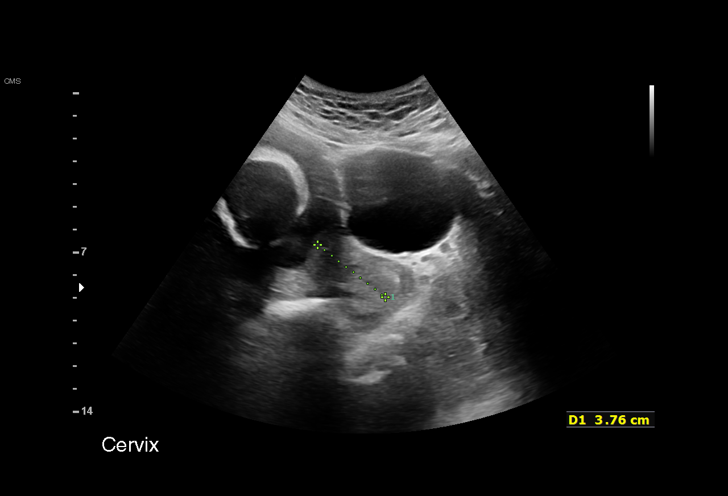
[im 96/100]
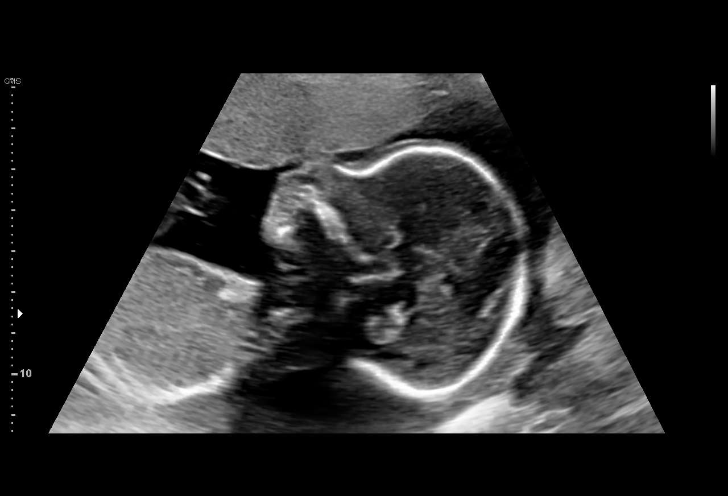

[13 of 28 positions shown; findings below may reference images not displayed]

[HOSPITAL],
                                                            Inc.

 ----------------------------------------------------------------------

 ----------------------------------------------------------------------
Indications

  Encounter for antenatal screening for
  malformations
  Previous child with congenital anomaly,
  antepartum (bilateral clubfeet)
  20 weeks gestation of pregnancy
 ----------------------------------------------------------------------
Fetal Evaluation

 Num Of Fetuses:         1
 Fetal Heart Rate(bpm):  138
 Cardiac Activity:       Observed
 Presentation:           Cephalic
 Placenta:               Anterior
 P. Cord Insertion:      Visualized

 Amniotic Fluid
 AFI FV:      Within normal limits

                             Largest Pocket(cm)

Biometry

 BPD:      49.5  mm     G. Age:  21w 0d         60  %    CI:        75.46   %    70 - 86
                                                         FL/HC:      19.6   %    15.9 -
 HC:      180.7  mm     G. Age:  20w 3d         32  %    HC/AC:      1.22        1.06 -
 AC:      147.9  mm     G. Age:  20w 1d         24  %    FL/BPD:     71.5   %
 FL:       35.4  mm     G. Age:  21w 1d         58  %    FL/AC:      23.9   %    20 - 24
 HUM:      30.2  mm     G. Age:  20w 0d         28  %
 CER:      20.9  mm     G. Age:  19w 6d         32  %
 CM:        3.9  mm

 Est. FW:     364  gm    0 lb 13 oz      43  %
OB History

 Gravidity:    2         Term:   0        Prem:   0        SAB:   0
 TOP:          1       Ectopic:  0        Living: 0
Gestational Age

 LMP:           20w 5d        Date:  04/15/18                 EDD:   01/20/19
 U/S Today:     20w 5d                                        EDD:   01/20/19
 Best:          20w 5d     Det. By:  LMP  (04/15/18)          EDD:   01/20/19
Anatomy

 Cranium:               Appears normal         LVOT:                   Appears normal
 Cavum:                 Appears normal         Aortic Arch:            Appears normal
 Ventricles:            Appears normal         Ductal Arch:            Appears normal
 Choroid Plexus:        Appears normal         Diaphragm:              Appears normal
 Cerebellum:            Appears normal         Stomach:                Appears normal, left
                                                                       sided
 Posterior Fossa:       Appears normal         Abdomen:                Appears normal
 Nuchal Fold:           Not applicable (>20    Abdominal Wall:         Appears nml (cord
                        wks GA)                                        insert, abd wall)
 Face:                  Appears normal         Cord Vessels:           Appears normal (3
                        (orbits and profile)                           vessel cord)
 Lips:                  Appears normal         Kidneys:                Appear normal
 Palate:                Appears normal         Bladder:                Appears normal
 Thoracic:              Appears normal         Spine:                  Appears normal
 Heart:                 Appears normal         Upper Extremities:      Appears normal
                        (4CH, axis, and
                        situs)
 RVOT:                  Appears normal         Lower Extremities:      Appears normal

 Other:  Heels and 5th digit visualized. Nasal bone visualized.
Cervix Uterus Adnexa

 Cervix
 Length:            3.8  cm.
 Normal appearance by transabdominal scan.

 Uterus
 No abnormality visualized.

 Left Ovary
 Not visualized.

 Right Ovary
 Not visualized.

 Cul De Sac
 No free fluid seen.

 Adnexa
 No abnormality visualized.
Impression

 Ms. Bish, Hyusmen2 [facility] at 20-weeks' gestation, is here for
 fetal anatomy scan. Obstetric history is significant for mid-
 trimester termination of pregnancy because of fetal anomaly
 (club feet). She had MFM ultrasound with us on 12/14/17
 (Hawlere Saqar).

 On cell-free fetal DNA screening, the risk for fetal
 aneuploidies are not increased.

 We performed a fetal anatomy scan. No markers of
 aneuploidies or fetal structural defects are seen. Both feet
 appear normal. Fetal biometry is consistent with her
 previously-established dates. Amniotic fluid is normal and
 good fetal activity is seen. Patient understands the limitations
 of ultrasound in detecting fetal anomalies.
Recommendations

 -Follow-up as clinically indicated.
                 Buehler, [place]

## 2022-01-24 ENCOUNTER — Encounter: Payer: Self-pay | Admitting: Emergency Medicine

## 2022-01-24 ENCOUNTER — Ambulatory Visit
Admission: EM | Admit: 2022-01-24 | Discharge: 2022-01-24 | Disposition: A | Discharge status: Home / Self Care | Payer: BC Managed Care – PPO | Attending: Internal Medicine | Admitting: Internal Medicine

## 2022-01-24 DIAGNOSIS — R300 Dysuria: Secondary | ICD-10-CM | POA: Diagnosis present

## 2022-01-24 DIAGNOSIS — N3001 Acute cystitis with hematuria: Secondary | ICD-10-CM | POA: Diagnosis not present

## 2022-01-24 LAB — POCT URINALYSIS DIP (MANUAL ENTRY)
Bilirubin, UA: NEGATIVE
Glucose, UA: NEGATIVE mg/dL
Ketones, POC UA: NEGATIVE mg/dL
Nitrite, UA: NEGATIVE
Protein Ur, POC: NEGATIVE mg/dL
Spec Grav, UA: 1.015 (ref 1.010–1.025)
Urobilinogen, UA: 0.2 E.U./dL
pH, UA: 6 (ref 5.0–8.0)

## 2022-01-24 MED ORDER — CEPHALEXIN 500 MG PO CAPS: 500.0000 mg | ORAL_CAPSULE | Freq: Four times a day (QID) | ORAL | 0 refills | Status: AC

## 2022-01-24 NOTE — ED Provider Notes (Signed)
[facility]    CSN: [id number] Arrival date & time: 01/24/22  1352      History   Chief Complaint Chief Complaint  Patient presents with   Dysuria    HPI Dana Reeves is a 31 y.o. female.   Patient presents with urinary burning and mild hematuria that has been present for approximately 2 days.  Denies urinary frequency, abdominal pain, fever, back pain, vaginal discharge, abnormal vaginal bleeding.  Patient denies concern for pregnancy or STD.   Dysuria  Past Medical History:  Diagnosis Date   Gestational diabetes    metformin   Medical history non-contributory     Patient Active Problem List   Diagnosis Date Noted   Indication for care in labor or delivery 01/18/2019   NSVD (normal spontaneous vaginal delivery) 01/18/2019    Past Surgical History:  Procedure Laterality Date   DILATION AND CURETTAGE OF UTERUS     THERAPEUTIC ABORTION      OB History     Gravida  2   Para  1   Term  1   Preterm  0   AB  1   Living  1      SAB  0   IAB  1   Ectopic  0   Multiple  0   Live Births  1            Home Medications    Prior to Admission medications   Medication Sig Start Date End Date Taking? Authorizing Provider  cephALEXin (KEFLEX) 500 MG capsule Take 1 capsule (500 mg total) by mouth 4 (four) times daily. 01/24/22  Yes Sammie Schermerhorn, Michele Rockers, FNP  acetaminophen (TYLENOL) 325 MG tablet Take 2 tablets (650 mg total) by mouth every 4 (four) hours as needed (for pain scale < 4). 01/20/19   Paula Compton, MD  ibuprofen (ADVIL) 600 MG tablet Take 1 tablet (600 mg total) by mouth every 6 (six) hours. 01/20/19   Paula Compton, MD  Prenatal Vit-Fe Fumarate-FA (PRENATAL VITAMIN PO) Take 1 tablet by mouth daily.     [provider]    Family History Family History  Problem Relation Age of Onset   Diabetes Mother    Hypertension Father     Social History Social History   Tobacco Use   Smoking status: Never   Smokeless  tobacco: Never  Vaping Use   Vaping Use: Never used  Substance Use Topics   Alcohol use: Not Currently    Comment: Social pre-preg   Drug use: Never     Allergies   Patient has no known allergies.   Review of Systems Review of Systems Per HPI  Physical Exam Triage Vital Signs ED Triage Vitals  Enc Vitals Group     BP 01/24/22 1402 115/78     Pulse Rate 01/24/22 1402 96     Resp 01/24/22 1402 18     Temp 01/24/22 1402 98.4 F (36.9 C)     Temp Source 01/24/22 1402 Oral     SpO2 01/24/22 1402 98 %     Weight 01/24/22 1403 135 lb (61.2 kg)     Height 01/24/22 1403 4\' 11"  (1.499 m)     Head Circumference --      Peak Flow --      Pain Score 01/24/22 1403 0     Pain Loc --      Pain Edu? --      Excl. in GC? --    No  data found.  Updated Vital Signs BP 115/78 (BP Location: Left Arm)   Pulse 96   Temp 98.4 F (36.9 C) (Oral)   Resp 18   Ht 4\' 11"  (1.499 m)   Wt 135 lb (61.2 kg)   SpO2 98%   Breastfeeding No   BMI 27.27 kg/m   Visual Acuity Right Eye Distance:   Left Eye Distance:   Bilateral Distance:    Right Eye Near:   Left Eye Near:    Bilateral Near:     Physical Exam Constitutional:      General: She is not in acute distress.    Appearance: Normal appearance. She is not toxic-appearing or diaphoretic.  HENT:     Head: Normocephalic and atraumatic.  Eyes:     Extraocular Movements: Extraocular movements intact.     Conjunctiva/sclera: Conjunctivae normal.  Cardiovascular:     Rate and Rhythm: Normal rate and regular rhythm.     Pulses: Normal pulses.     Heart sounds: Normal heart sounds.  Pulmonary:     Effort: Pulmonary effort is normal.     Breath sounds: Normal breath sounds.  Abdominal:     General: Bowel sounds are normal. There is no distension.     Palpations: Abdomen is soft.     Tenderness: There is no abdominal tenderness.  Neurological:     General: No focal deficit present.     Mental Status: She is alert and oriented to  person, place, and time. Mental status is at baseline.  Psychiatric:        Mood and Affect: Mood normal.        Behavior: Behavior normal.        Thought Content: Thought content normal.        Judgment: Judgment normal.     UC Treatments / Results  Labs (all labs ordered are listed, but only abnormal results are displayed) Labs Reviewed  POCT URINALYSIS DIP (MANUAL ENTRY) - Abnormal; Notable for the following components:      Result Value   Clarity, UA cloudy (*)    Blood, UA large (*)    Leukocytes, UA Large (3+) (*)    All other components within normal limits  URINE CULTURE    EKG   Radiology No results found.  Procedures Procedures (including critical care time)  Medications Ordered in UC Medications - No data to display  Initial Impression / Assessment and Plan / UC Course  I have reviewed the triage vital signs and the nursing notes.  Pertinent labs & imaging results that were available during my care of the patient were reviewed by me and considered in my medical decision making (see chart for details).     Urinalysis indicating urinary tract infection.  Will treat with cephalexin antibiotic.  Urine culture is pending.  Discussed return precautions.  Patient verbalized understanding and was agreeable with plan. Final Clinical Impressions(s) / UC Diagnoses   Final diagnoses:  Acute cystitis with hematuria  Dysuria     Discharge Instructions      It appears that you have a urinary tract infection which is being treated with an antibiotic.  Urine culture is pending.  Please follow-up if symptoms persist or worsen.    ED Prescriptions     Medication Sig Dispense Auth. Provider   cephALEXin (KEFLEX) 500 MG capsule Take 1 capsule (500 mg total) by mouth 4 (four) times daily. 28 capsule [place], Michele Rockers, [place]      PDMP not  reviewed this encounter.   Teodora Medici, [place] 01/24/22 [phone]

## 2022-01-24 NOTE — ED Triage Notes (Signed)
Patient c/o possible UTI, dysuria and some hematuria x 2 days.  Patient denies any OTC pain meds.

## 2022-01-24 NOTE — Discharge Instructions (Signed)
It appears that you have a urinary tract infection which is being treated with an antibiotic.  Urine culture is pending.  Please follow-up if symptoms persist or worsen.

## 2022-01-25 LAB — URINE CULTURE
